# Patient Record
Sex: Male | Born: 1955 | Race: White | Hispanic: No | Marital: Single | Smoking: Never smoker
Health system: Southern US, Community
[De-identification: ages and names within clinical notes are randomized; demographics above are authoritative.]

## PROBLEM LIST (undated history)

## (undated) DIAGNOSIS — E785 Hyperlipidemia, unspecified: Secondary | ICD-10-CM

## (undated) DIAGNOSIS — J869 Pyothorax without fistula: Secondary | ICD-10-CM

## (undated) HISTORY — DX: Pyothorax without fistula: J86.9

## (undated) HISTORY — DX: Hyperlipidemia, unspecified: E78.5

---

## 2009-03-09 DIAGNOSIS — J869 Pyothorax without fistula: Secondary | ICD-10-CM

## 2009-03-09 HISTORY — DX: Pyothorax without fistula: J86.9

## 2009-03-09 HISTORY — PX: EMPYEMA DRAINAGE: SHX5097

## 2009-10-11 ENCOUNTER — Encounter (INDEPENDENT_AMBULATORY_CARE_PROVIDER_SITE_OTHER): Payer: Self-pay | Admitting: *Deleted

## 2009-10-21 ENCOUNTER — Ambulatory Visit (HOSPITAL_COMMUNITY): Admission: EM | Admit: 2009-10-21 | Discharge: 2009-10-22 | Payer: Self-pay | Admitting: Emergency Medicine

## 2009-10-21 ENCOUNTER — Ambulatory Visit: Payer: Self-pay | Admitting: Internal Medicine

## 2009-10-31 ENCOUNTER — Ambulatory Visit: Payer: Self-pay | Admitting: Family Medicine

## 2009-10-31 ENCOUNTER — Inpatient Hospital Stay (HOSPITAL_COMMUNITY): Admission: EM | Admit: 2009-10-31 | Discharge: 2009-11-06 | Payer: Self-pay | Admitting: Emergency Medicine

## 2009-10-31 ENCOUNTER — Ambulatory Visit: Payer: Self-pay | Admitting: Internal Medicine

## 2009-11-01 ENCOUNTER — Encounter: Payer: Self-pay | Admitting: Family Medicine

## 2009-11-01 ENCOUNTER — Ambulatory Visit: Payer: Self-pay | Admitting: Thoracic Surgery (Cardiothoracic Vascular Surgery)

## 2009-11-12 ENCOUNTER — Ambulatory Visit: Payer: Self-pay | Admitting: Thoracic Surgery (Cardiothoracic Vascular Surgery)

## 2009-11-13 ENCOUNTER — Encounter (INDEPENDENT_AMBULATORY_CARE_PROVIDER_SITE_OTHER): Payer: Self-pay | Admitting: *Deleted

## 2009-11-13 ENCOUNTER — Telehealth: Payer: Self-pay | Admitting: Internal Medicine

## 2009-11-13 ENCOUNTER — Ambulatory Visit: Payer: Self-pay | Admitting: Internal Medicine

## 2009-11-25 ENCOUNTER — Ambulatory Visit: Payer: Self-pay | Admitting: Thoracic Surgery (Cardiothoracic Vascular Surgery)

## 2010-01-03 DIAGNOSIS — J869 Pyothorax without fistula: Secondary | ICD-10-CM | POA: Insufficient documentation

## 2010-01-03 DIAGNOSIS — E785 Hyperlipidemia, unspecified: Secondary | ICD-10-CM | POA: Insufficient documentation

## 2010-01-03 DIAGNOSIS — T18108A Unspecified foreign body in esophagus causing other injury, initial encounter: Secondary | ICD-10-CM

## 2010-01-03 DIAGNOSIS — K209 Esophagitis, unspecified without bleeding: Secondary | ICD-10-CM | POA: Insufficient documentation

## 2010-01-03 DIAGNOSIS — K449 Diaphragmatic hernia without obstruction or gangrene: Secondary | ICD-10-CM | POA: Insufficient documentation

## 2010-03-30 ENCOUNTER — Encounter: Payer: Self-pay | Admitting: Thoracic Surgery (Cardiothoracic Vascular Surgery)

## 2010-04-08 NOTE — Letter (Signed)
Summary: Previsit letter  [facility]  [address] [place], [place] [address]   Phone: [phone]  Fax: [phone]       10/11/2009 MRN: [id number]  [facility] [address] [place], [place]  [address]  Dear Willie Armstrong,  Welcome to the Gastroenterology Division at [facility].    You are scheduled to see a nurse for your pre-procedure visit on 11-13-09 at 1:00P.M. on the 3rd floor at [facility], [address].  We ask that you try to arrive at our office 15 minutes prior to your appointment time to allow for check-in.  Your nurse visit will consist of discussing your medical and surgical history, your immediate family medical history, and your medications.    Please bring a complete list of all your medications or, if you prefer, bring the medication bottles and we will list them.  We will need to be aware of both prescribed and over the counter drugs.  We will need to know exact dosage information as well.  If you are on blood thinners (Coumadin, Plavix, Aggrenox, Ticlid, etc.) please call our office today/prior to your appointment, as we need to consult with your physician about holding your medication.   Please be prepared to read and sign documents such as consent forms, a financial agreement, and acknowledgement forms.  If necessary, and with your consent, a friend or relative is welcome to sit-in on the nurse visit with you.  Please bring your insurance card so that we may make a copy of it.  If your insurance requires a referral to see a specialist, please bring your referral form from your primary care physician.  No co-pay is required for this nurse visit.     If you cannot keep your appointment, please call [phone] to cancel or reschedule prior to your appointment date.  This allows [place] the opportunity to schedule an appointment for another patient in need of care.    Thank you for choosing [facility] for your  medical needs.  We appreciate the opportunity to care for you.  Please visit [place] at our website  to learn more about our practice.                     Sincerely.                                                                                                                   The Gastroenterology Division

## 2010-04-08 NOTE — Procedures (Signed)
Summary: Upper Endoscopy  Patient: Willie Armstrong Note: All result statuses are Final unless otherwise noted.  Tests: (1) Upper Endoscopy (EGD)   EGD Upper Endoscopy       DONE     [facility]     [address] [place], [place]  [address]           ENDOSCOPY PROCEDURE REPORT           PATIENT:  Willie, Armstrong  MR#:  [id number]     BIRTHDATE:  12/20/1955, 54 yrs. old  GENDER:  male           ENDOSCOPIST:  Hedwig Morton. Juanda Chance, MD     Referred by:           PROCEDURE DATE:  10/21/2009     PROCEDURE:  EGD with foreign body removal     ASA CLASS:  Class I     INDICATIONS:  food impaction           MEDICATIONS:   Versed 12.5 mg, Fentanyl 125 mcg     TOPICAL ANESTHETIC:  Cetacaine Spray           DESCRIPTION OF PROCEDURE:   After the risks benefits and     alternatives of the procedure were thoroughly explained, informed     consent was obtained.  The  endoscope was introduced through the     mouth and advanced to the second portion of the duodenum, without     limitations.  The instrument was slowly withdrawn as the mucosa     was fully examined.           The examination was normal with no endoscopic findings.     Retroflexion was not performed.  The scope was then withdrawn from     the patient and the procedure completed.           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           REPEAT EXAM:  3-4           ______________________________     Hedwig Morton. Juanda Chance, MD           CC:           n.     eSIGNED:   Hedwig Morton. Brodie  at 10/22/2009 12:08 AM           Willie Armstrong, Willie Armstrong, [id number]  Note: An exclamation mark (!) indicates a result that was not dispersed into the flowsheet. Document Creation Date: 10/22/2009 12:09 AM _______________________________________________________________________  (1) Order result status: Final Collection or observation date-time: 10/21/2009 23:42 Requested date-time:  Receipt date-time:  Reported date-time:  Referring Physician:   Ordering Physician: Lina Sar [phone]) Specimen Source:  Source: Launa Grill Order Number: [id number] Lab site:  Appended Document: Upper Endoscopy Janifer Adie, This pt had an food impaction removed from last night. He needs repeat EGD/Dil?Bx in several weeks. He said that he is planning to have a colonoscopy at [facility] in September. It ought to be done at the same time. Can You arrange that? Thanx Dora  Appended Document: Upper Endoscopy Patient  is rescheduled to 11/28/09 for ESA and colon.  He will keep his pre-visit for 11/13/09

## 2010-04-08 NOTE — Progress Notes (Signed)
Summary: ?need to be seen in office  Dr Juanda Chance, You saw Mr. Willie Armstrong in ER for foood impaction on 10-22-2009.  2 days after that  he was admitted to hospital with pneumonia.  He was on vent and had chest tubes.  He does not want to have screening colonoscopy until he is stronger.  You had scheduled him for ESA at time of colonoscopy.  He is not having any difficulty swallowing at this time.  He has been scheduled for office visit for November.  Do you want to seem him in the office sooner than this? Thanks-- Willie Armstrong I think He ought to wait with the colonoscopy until he fully recoveres from the pneumonia.. I will see him in the office in November and then we decide what to do and when. DB

## 2010-04-08 NOTE — Letter (Signed)
Summary: New Patient letter  [facility]  [address] [place], [place] [address]   Phone: [phone]  Fax: [phone]       11/13/2009 MRN: [id number]  [facility] [address] [place], [place]  [address]  Dear Mr. Macgowan,  Welcome to the Gastroenterology Division at [facility].    You are scheduled to see Dr.  Juanda Chance on 01-09-10 at 10:30A.M. on the 3rd floor at [facility], [address].  We ask that you try to arrive at our office 15 minutes prior to your appointment time to allow for check-in.  We would like you to complete the enclosed self-administered evaluation form prior to your visit and bring it with you on the day of your appointment.  We will review it with you.  Also, please bring a complete list of all your medications or, if you prefer, bring the medication bottles and we will list them.  Please bring your insurance card so that we may make a copy of it.  If your insurance requires a referral to see a specialist, please bring your referral form from your primary care physician.  Co-payments are due at the time of your visit and may be paid by cash, check or credit card.     Your office visit will consist of a consult with your physician (includes a physical exam), any laboratory testing he/she may order, scheduling of any necessary diagnostic testing (e.g. x-ray, ultrasound, CT-scan), and scheduling of a procedure (e.g. Endoscopy, Colonoscopy) if required.  Please allow enough time on your schedule to allow for any/all of these possibilities.    If you cannot keep your appointment, please call [phone] to cancel or reschedule prior to your appointment date.  This allows [place] the opportunity to schedule an appointment for another patient in need of care.  If you do not cancel or reschedule by 5 p.m. the business day prior to your appointment date, you will be charged a $50.00 late cancellation/no-show fee.    Thank you for  choosing [facility] for your medical needs.  We appreciate the opportunity to care for you.  Please visit [place] at our website  to learn more about our practice.                     Sincerely,                                                             The Gastroenterology Division

## 2010-04-09 ENCOUNTER — Institutional Professional Consult (permissible substitution): Payer: Self-pay | Admitting: Critical Care Medicine

## 2010-05-23 ENCOUNTER — Institutional Professional Consult (permissible substitution) (INDEPENDENT_AMBULATORY_CARE_PROVIDER_SITE_OTHER): Payer: BC Managed Care – PPO | Admitting: Critical Care Medicine

## 2010-05-23 ENCOUNTER — Encounter: Payer: Self-pay | Admitting: Critical Care Medicine

## 2010-05-23 DIAGNOSIS — J869 Pyothorax without fistula: Secondary | ICD-10-CM

## 2010-05-23 LAB — BLOOD GAS, ARTERIAL
Acid-base deficit: 2 mmol/L (ref 0.0–2.0)
Bicarbonate: 25.8 mEq/L — ABNORMAL HIGH (ref 20.0–24.0)
Drawn by: 25203
Drawn by: 32470
O2 Content: 2 L/min
Patient temperature: 98.6
pCO2 arterial: 34.6 mmHg — ABNORMAL LOW (ref 35.0–45.0)
pCO2 arterial: 37.5 mmHg (ref 35.0–45.0)
pH, Arterial: 7.452 — ABNORMAL HIGH (ref 7.350–7.450)
pO2, Arterial: 92.2 mmHg (ref 80.0–100.0)

## 2010-05-23 LAB — CBC
Hemoglobin: 11.4 g/dL — ABNORMAL LOW (ref 13.0–17.0)
MCH: 30.8 pg (ref 26.0–34.0)
MCH: 31.9 pg (ref 26.0–34.0)
MCH: 32 pg (ref 26.0–34.0)
MCH: 33.1 pg (ref 26.0–34.0)
MCHC: 33.7 g/dL (ref 30.0–36.0)
MCHC: 33.8 g/dL (ref 30.0–36.0)
MCV: 91.4 fL (ref 78.0–100.0)
MCV: 93.2 fL (ref 78.0–100.0)
MCV: 94.9 fL (ref 78.0–100.0)
Platelets: 290 10*3/uL (ref 150–400)
Platelets: 309 10*3/uL (ref 150–400)
Platelets: 315 10*3/uL (ref 150–400)
Platelets: 391 10*3/uL (ref 150–400)
Platelets: 447 10*3/uL — ABNORMAL HIGH (ref 150–400)
RBC: 3.39 MIL/uL — ABNORMAL LOW (ref 4.22–5.81)
RBC: 3.47 MIL/uL — ABNORMAL LOW (ref 4.22–5.81)
RBC: 3.57 MIL/uL — ABNORMAL LOW (ref 4.22–5.81)
RBC: 4.71 MIL/uL (ref 4.22–5.81)
RDW: 12.7 % (ref 11.5–15.5)
RDW: 12.8 % (ref 11.5–15.5)
WBC: 12.9 10*3/uL — ABNORMAL HIGH (ref 4.0–10.5)
WBC: 14.3 10*3/uL — ABNORMAL HIGH (ref 4.0–10.5)
WBC: 14.8 10*3/uL — ABNORMAL HIGH (ref 4.0–10.5)

## 2010-05-23 LAB — COMPREHENSIVE METABOLIC PANEL
ALT: 32 U/L (ref 0–53)
AST: 13 U/L (ref 0–37)
AST: 15 U/L (ref 0–37)
AST: 22 U/L (ref 0–37)
AST: 91 U/L — ABNORMAL HIGH (ref 0–37)
Albumin: 1.8 g/dL — ABNORMAL LOW (ref 3.5–5.2)
Albumin: 2.5 g/dL — ABNORMAL LOW (ref 3.5–5.2)
Albumin: 3.8 g/dL (ref 3.5–5.2)
Alkaline Phosphatase: 85 U/L (ref 39–117)
BUN: 15 mg/dL (ref 6–23)
BUN: 4 mg/dL — ABNORMAL LOW (ref 6–23)
BUN: 9 mg/dL (ref 6–23)
CO2: 23 mEq/L (ref 19–32)
Calcium: 7.5 mg/dL — ABNORMAL LOW (ref 8.4–10.5)
Calcium: 8.2 mg/dL — ABNORMAL LOW (ref 8.4–10.5)
Calcium: 8.3 mg/dL — ABNORMAL LOW (ref 8.4–10.5)
Chloride: 100 mEq/L (ref 96–112)
Chloride: 100 mEq/L (ref 96–112)
Creatinine, Ser: 0.91 mg/dL (ref 0.4–1.5)
Creatinine, Ser: 0.98 mg/dL (ref 0.4–1.5)
Creatinine, Ser: 1.21 mg/dL (ref 0.4–1.5)
GFR calc Af Amer: >60 mL/min (ref >60)
GFR calc Af Amer: >60 mL/min (ref >60)
GFR calc Af Amer: >60 mL/min (ref >60)
GFR calc Af Amer: >60 mL/min (ref >60)
GFR calc non Af Amer: >60 mL/min (ref >60)
GFR calc non Af Amer: >60 mL/min (ref >60)
Glucose, Bld: 134 mg/dL — ABNORMAL HIGH (ref 70–99)
Potassium: 3.5 mEq/L (ref 3.5–5.1)
Potassium: 4.1 mEq/L (ref 3.5–5.1)
Sodium: 132 mEq/L — ABNORMAL LOW (ref 135–145)
Sodium: 134 mEq/L — ABNORMAL LOW (ref 135–145)
Total Bilirubin: 0.6 mg/dL (ref 0.3–1.2)
Total Bilirubin: 1 mg/dL (ref 0.3–1.2)
Total Protein: 5.1 g/dL — ABNORMAL LOW (ref 6.0–8.3)

## 2010-05-23 LAB — DIFFERENTIAL
Basophils Absolute: 0 10*3/uL (ref 0.0–0.1)
Eosinophils Absolute: 0 10*3/uL (ref 0.0–0.7)
Eosinophils Relative: 0 % (ref 0–5)
Lymphocytes Relative: 10 % — ABNORMAL LOW (ref 12–46)
Monocytes Absolute: 1.2 10*3/uL — ABNORMAL HIGH (ref 0.1–1.0)

## 2010-05-23 LAB — BASIC METABOLIC PANEL
BUN: 13 mg/dL (ref 6–23)
CO2: 25 mEq/L (ref 19–32)
Calcium: 7.5 mg/dL — ABNORMAL LOW (ref 8.4–10.5)
Chloride: 106 mEq/L (ref 96–112)
Creatinine, Ser: 0.98 mg/dL (ref 0.4–1.5)
Creatinine, Ser: 0.76 mg/dL (ref 0.4–1.5)
GFR calc Af Amer: >60 mL/min (ref >60)
GFR calc Af Amer: >60 mL/min (ref >60)
Glucose, Bld: 123 mg/dL — ABNORMAL HIGH (ref 70–99)
Sodium: 135 mEq/L (ref 135–145)

## 2010-05-23 LAB — LACTATE DEHYDROGENASE, PLEURAL OR PERITONEAL FLUID: LD, Fluid: 1181 U/L — ABNORMAL HIGH (ref 3–23)

## 2010-05-23 LAB — BODY FLUID CULTURE: Culture: NO GROWTH

## 2010-05-23 LAB — CARDIAC PANEL(CRET KIN+CKTOT+MB+TROPI)
CK, MB: 0.7 ng/mL (ref 0.3–4.0)
Relative Index: INVALID (ref 0.0–2.5)
Total CK: 50 U/L (ref 7–232)
Troponin I: 0.02 ng/mL (ref 0.00–0.06)

## 2010-05-23 LAB — TYPE AND SCREEN: ABO/RH(D): O POS

## 2010-05-23 LAB — POCT I-STAT 3, ART BLOOD GAS (G3+)
Acid-base deficit: 1 mmol/L (ref 0.0–2.0)
Bicarbonate: 22.7 mEq/L (ref 20.0–24.0)
O2 Saturation: 99 %
pO2, Arterial: 125 mmHg — ABNORMAL HIGH (ref 80.0–100.0)

## 2010-05-23 LAB — POCT CARDIAC MARKERS
CKMB, poc: <1 ng/mL — ABNORMAL LOW (ref 1.0–8.0)
Troponin i, poc: 0.07 ng/mL (ref 0.00–0.09)
Troponin i, poc: <0.05 ng/mL (ref 0.00–0.09)

## 2010-05-23 LAB — LIPASE, FLUID: Lipase-Fluid: 23 U/L

## 2010-05-23 LAB — ABO/RH: ABO/RH(D): O POS

## 2010-05-23 LAB — MRSA PCR SCREENING: MRSA by PCR: NEGATIVE

## 2010-05-23 LAB — BODY FLUID CELL COUNT WITH DIFFERENTIAL: Monocyte-Macrophage-Serous Fluid: 8 % — ABNORMAL LOW (ref 50–90)

## 2010-05-23 LAB — APTT: aPTT: 31 seconds (ref 24–37)

## 2010-05-23 LAB — GLUCOSE, SEROUS FLUID

## 2010-05-23 LAB — LACTATE DEHYDROGENASE: LDH: 106 U/L (ref 94–250)

## 2010-05-23 LAB — PROTIME-INR: INR: 1.31 (ref 0.00–1.49)

## 2010-05-23 LAB — CULTURE, BLOOD (ROUTINE X 2)

## 2010-05-23 LAB — HIV-1 RNA, QUALITATIVE, TMA: HIV-1 RNA, Qualitative, TMA: NOT DETECTED

## 2010-06-05 NOTE — Assessment & Plan Note (Signed)
Summary: Pulmonary Consultation   Copy to:  Eula Listen, PA @ Urgent Care at [facility] Primary Provider/Referring Provider:  Eula Listen, PA @ Urgent Care at [facility]  CC:  Pulmonary Consult  - empyema and recent PNA.  Marland Kitchen  History of Present Illness: Pulmonary Consultation 55 yo Hispanic Male,  hx of L empyema with aspiration pna, meat stuck in esophagus and ARDS/PNA  8/11:  PNA/ L empyema/ ARDS/vent / septic shock :  d/c 9/11.   Density in LLL persistes pleural based.   Last film 1/12.  No films sent for review, only reports.   symptoms now:  occ tightness in LLL.  no real cough.  only happens at odd times.  Feels can get full chest of air.  no exercises ok,  aerobics is ok,  no running, fast walking, weight training is all ok no fever/chills/sweats.  first time with pna had food caught in throat  no problems since.   Preventive Screening-Counseling & Management  Alcohol-Tobacco     Smoking Status: never  Current Medications (verified): 1)  Pravachol 40 Mg Tabs (Pravastatin Sodium) .... Take 1 Tablet By Mouth Once A Day 2)  Fish Oil 1000 Mg Caps (Omega-3 Fatty Acids) .... Take 2 Capsules By Mouth Once Daily 3)  Ibuprofen 600 Mg Tabs (Ibuprofen) .... Take 1 Tablet By Mouth Every 6 Hours As Needed  Allergies (verified): No Known Drug Allergies  Past History:  Past medical, surgical, family and social histories (including risk factors) reviewed, and no changes noted (except as noted below).  Past Medical History: Current Problems:  EMPYEMA (ICD-510.9)   -LLL  with ards/pna.  HYPERLIPIDEMIA (ICD-272.4) Hx of FOREIGN BODY IN ESOPHAGUS (ICD-935.1)    -aspiration of steak in esophagus lead to asp PNA and ARDS/empyema . ESOPHAGITIS (ICD-530.10) HIATAL HERNIA (ICD-553.3)    Past Surgical History: thoracoscopy for drainage of empyema   -8/11  Family History: Reviewed history from 01/03/2010 and no changes required. Family History of Heart Disease: Father sister deceased from  cancer   Social History: Reviewed history from 01/03/2010 and no changes required. 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  The patient denies shortness of breath with activity, shortness of breath at rest, productive cough, non-productive cough, coughing up blood, chest pain, irregular heartbeats, acid heartburn, loss of appetite, weight change, abdominal pain, difficulty swallowing, sore throat, tooth/dental problems, headaches, sneezing, itching, ear ache, anxiety, depression, hand/feet swelling, joint stiffness or pain, rash, change in color of mucus, and fever.    Vital Signs:  Patient profile:   55 year old male Height:      69 inches Weight:      177.50 pounds BMI:     26.31 O2 Sat:      95 % on Room air Temp:     98.4 degrees F oral Pulse rate:   78 / minute BP sitting:   98 / 62  (left arm) Cuff size:   large  Vitals Entered By: Gweneth Dimitri RN (May 23, 2010 2:58 PM)  O2 Flow:  Room air CC: Pulmonary Consult  - empyema, recent PNA.   Comments Medications reviewed with patient Daytime contact number verified with patient. Gweneth Dimitri RN  May 23, 2010 2:58 PM    Physical Exam  Additional Exam:  Gen:  Pleasant, well-nourished, in no distress,  normal affect ENT: No lesions,  mouth clear,  oropharynx clear, no postnasal drip Neck: No JVD, no TMG, no carotid bruits Lungs: No use of accessory muscles, no dullness to percussion, clear without rales or rhonchi Cardiovascular: RRR, heart sounds normal, no murmur or gallops, no peripheral edema Abdomen: soft and NT, no HSM,  BS normal Musculoskeletal: No deformities, no cyanosis or clubbing Neuro: alert, non focal Skin: Warm, no lesions or rashes    CXR  Procedure date:   04/03/2010  Findings:      stable pleuroparenchymal densities  Impression & Recommendations:  Problem # 1:  EMPYEMA (ICD-510.9) Assessment Improved hx of empyema with VATS drainage of L pleural space,  now resolved area under question is likely chronic pleural thickening , no further w/u needed plan no further w/u needed return pulm prn Orders: New Patient Level V ([address])  Complete Medication List: 1)  Pravachol 40 Mg Tabs (Pravastatin sodium) .... Take 1 tablet by mouth once a day 2)  Fish Oil 1000 Mg Caps (Omega-3 fatty acids) .... Take 2 capsules by mouth once daily 3)  Ibuprofen 600 Mg Tabs (Ibuprofen) .... Take 1 tablet by mouth every 6 hours as needed  Patient Instructions: 1)  No change in medications 2)  I will call when I get your chest xrays     Appended Document: Pulmonary Consultation fax ryan dunn [email or web address] [place]

## 2010-07-22 NOTE — Assessment & Plan Note (Signed)
OFFICE VISIT   Willie Armstrong, Willie Armstrong  DOB:  01-03-1956                                        November 25, 2009  CHART #:  [id number]   HISTORY:  The patient returns for routine followup status post left VATS  for drainage of empyema on November 01, 2009.  The patient had acute  suppurative empyema complicating community-acquired pneumonia.  He has  recovered uneventfully.  Since hospital discharge, he returns for  routine followup.  He has not had any problems.  He reports that he has  not had any pain.  He still has a little bit of numbness and a pressure-  like sensation in the left anterior chest wall typical for recent VATS  procedure.  He reports that he continues to exercise quite regularly and  he thinks his breathing capacity is almost back to baseline.  He has not  had any fevers or chills.  His cough has continued to gradually clear  up.  He completed his course of oral antibiotics and has had no further  problems.  The remainder of his review of systems is unremarkable.  The  remainder of his past medical history is unchanged.   PHYSICAL EXAMINATION:  Notable for well-appearing male with blood  pressure 137/88, pulse 92, and oxygen saturation 97% on room air.  Temperature 98.4 degrees Fahrenheit.  Examination of the chest reveals 3  small port incisions from video-assisted thoracoscopic surgery, all of  which are healing nicely.  Auscultation of the chest reveals clear  breath sounds with fairly symmetrical breath sounds that are slightly  diminished at the left lung base.  No wheezes, rales, or rhonchi are  noted.  Cardiovascular exam includes regular rate and rhythm.  The  abdomen is soft and nontender.  The extremities are warm and well  perfused.   DIAGNOSTIC TESTS:  Chest x-ray performed at Urgent Care on [address]  on November 21, 2009, is reviewed.  This demonstrates further interval  clearing of the lung parenchyma at the left lung  base in comparison with  the last chest x-ray performed prior to hospital discharge on November 06, 2009.  There is still some slight elevation of the left hemidiaphragm  and associated atelectasis and patchy opacity of the left lung base, but  there has been further significant clearing over the last 3 weeks.  No  other abnormalities are noted.   IMPRESSION:  Excellent progress following recent video-assisted  thoracoscopic surgery for drainage of acute suppurative empyema.   PLAN:  I have encouraged the patient to continue to gradually increase  his physical activity as tolerated.  He has no particular limitations at  this point.  All of his questions have been answered.  In the future, he  will call and return to see [place] as needed.   Salvatore Decent. Cornelius Moras, M.D.  Electronically Signed   CHO/MEDQ  D:  11/25/2009  T:  11/26/2009  Job:  [id number]   cc:   Ardyth Gal, MD

## 2011-02-16 ENCOUNTER — Other Ambulatory Visit: Payer: Self-pay | Admitting: Gastroenterology

## 2012-02-22 ENCOUNTER — Encounter: Payer: Self-pay | Admitting: Physician Assistant

## 2012-02-22 ENCOUNTER — Ambulatory Visit (INDEPENDENT_AMBULATORY_CARE_PROVIDER_SITE_OTHER): Payer: BC Managed Care – PPO | Admitting: Physician Assistant

## 2012-02-22 VITALS — BP 116/80 | HR 64 | Temp 98.0°F | Resp 16 | Ht 67.0 in | Wt 183.0 lb

## 2012-02-22 DIAGNOSIS — Z0000 Encounter for general adult medical examination without abnormal findings: Secondary | ICD-10-CM

## 2012-02-22 DIAGNOSIS — Z23 Encounter for immunization: Secondary | ICD-10-CM

## 2012-02-22 DIAGNOSIS — E785 Hyperlipidemia, unspecified: Secondary | ICD-10-CM

## 2012-02-22 LAB — POCT URINALYSIS DIPSTICK
Blood, UA: NEGATIVE
Leukocytes, UA: NEGATIVE
Nitrite, UA: NEGATIVE
Urobilinogen, UA: 0.2
pH, UA: 6

## 2012-02-22 LAB — CBC
MCH: 32.5 pg (ref 26.0–34.0)
MCHC: 35.8 g/dL (ref 30.0–36.0)
MCV: 90.7 fL (ref 78.0–100.0)
Platelets: 329 10*3/uL (ref 150–400)
RDW: 13.4 % (ref 11.5–15.5)

## 2012-02-22 LAB — POCT UA - MICROSCOPIC ONLY
Casts, Ur, LPF, POC: NEGATIVE
Mucus, UA: NEGATIVE
Yeast, UA: NEGATIVE

## 2012-02-22 NOTE — Progress Notes (Signed)
  Subjective:    Patient ID: Willie Armstrong, male    DOB: [birth year], 55 y.o.   MRN: [id number]  HPI    Review of Systems  Constitutional: Negative.   HENT: Negative.   Eyes: Negative.   Respiratory: Negative.   Cardiovascular: Negative.   Gastrointestinal: Negative.   Genitourinary: Negative.   Musculoskeletal: Negative.   Skin: Negative.   Neurological: Negative.   Hematological: Negative.   Psychiatric/Behavioral: Negative.        Objective:   Physical Exam        Assessment & Plan:

## 2012-02-23 ENCOUNTER — Other Ambulatory Visit: Payer: Self-pay | Admitting: Physician Assistant

## 2012-02-23 ENCOUNTER — Encounter: Payer: Self-pay | Admitting: Physician Assistant

## 2012-02-23 DIAGNOSIS — R195 Other fecal abnormalities: Secondary | ICD-10-CM

## 2012-02-23 LAB — TSH: TSH: 1.186 u[IU]/mL (ref 0.350–4.500)

## 2012-02-23 LAB — LIPID PANEL
Cholesterol: 206 mg/dL — ABNORMAL HIGH (ref 0–200)
LDL Cholesterol: 125 mg/dL — ABNORMAL HIGH (ref 0–99)
Total CHOL/HDL Ratio: 4.2 Ratio
VLDL: 32 mg/dL (ref 0–40)

## 2012-02-23 LAB — PSA: PSA: 0.5 ng/mL (ref <=4.00)

## 2012-02-23 LAB — COMPREHENSIVE METABOLIC PANEL
ALT: 43 U/L (ref 0–53)
AST: 23 U/L (ref 0–37)
Alkaline Phosphatase: 63 U/L (ref 39–117)
Creat: 0.75 mg/dL (ref 0.50–1.35)
Total Bilirubin: 0.8 mg/dL (ref 0.3–1.2)

## 2012-02-23 MED ORDER — ATORVASTATIN CALCIUM 20 MG PO TABS: 20.0000 mg | ORAL_TABLET | Freq: Every day | ORAL | Status: DC

## 2012-02-23 NOTE — Progress Notes (Signed)
Patient ID: Willie Armstrong MRN: [id number], DOB: 01-31-1956 56 y.o. Date of Encounter: 02/23/2012, 10:01 AM  Primary Physician: Carola Frost  Chief Complaint: Physical (CPE)  HPI: 56 y.o. y/o male with history noted below here for CPE. Doing well. No issues/complaints. Last CPE was . He has gotten back and feels like even surpassed where he was physically prior to his empyema episode in 2011. No issues with breathing. He is exercising almost daily and pushing himself hard. When he does exercise he really tries to deep breathe, and feels like this helps him. He does note occassionally he will have a catch in his back but this does not concern him. He feels like he is "back in my early 76's." Continues to eat a very healthy diet of fish and vegetables. Had his colonoscopy in 2012, removed a couple non-cancerous polyps, will have a repeat in 2017. He plans to live until he is 89.   Review of Systems: Consitutional: No fever, chills, fatigue, night sweats, lymphadenopathy, or weight changes. Eyes: No visual changes, eye redness, or discharge. ENT/Mouth: Ears: No otalgia, tinnitus, hearing loss, discharge. Nose: No congestion, rhinorrhea, sinus pain, or epistaxis. Throat: No sore throat, post nasal drip, or teeth pain. Cardiovascular: No CP, palpitations, diaphoresis, DOE, edema, orthopnea, PND. Respiratory: No cough, hemoptysis, SOB, or wheezing. Gastrointestinal: No anorexia, dysphagia, reflux, pain, nausea, vomiting, hematemesis, diarrhea, constipation, BRBPR, or melena. Genitourinary: No dysuria, frequency, urgency, hematuria, incontinence, nocturia, decreased urinary stream, discharge, impotence, or testicular pain/masses. Musculoskeletal: No decreased ROM, myalgias, stiffness, joint swelling, or weakness. Skin: No rash, erythema, lesion changes, pain, warmth, jaundice, or pruritis. Neurological: No headache, dizziness, syncope, seizures, tremors, memory loss, coordination problems, or  paresthesias. Psychological: No anxiety, depression, hallucinations, SI/HI. Endocrine: No fatigue, polydipsia, polyphagia, polyuria, or known diabetes. All other systems were reviewed and are otherwise negative.  Past Medical History  Diagnosis Date  . Hyperlipidemia      Past Surgical History  Procedure Date  . Empyema drainage 2011    Likely chemical, culture with no growth    Home Meds:  Prior to Admission medications   Medication Sig Start Date End Date Taking? Authorizing Provider  fish oil-omega-3 fatty acids 1000 MG capsule Take 2 g by mouth daily.   Yes Historical Provider, MD  pravastatin (PRAVACHOL) 40 MG tablet Take 40 mg by mouth daily.   Yes Historical Provider, MD    Allergies: No Known Allergies  History   Social History  . Marital Status: Single    Spouse Name: N/A    Number of Children: N/A  . Years of Education: N/A   Occupational History  . Not on file.   Social History Main Topics  . Smoking status: Never Smoker   . Smokeless tobacco: Not on file  . Alcohol Use: Yes  . Drug Use: No  . Sexually Active: Not on file   Other Topics Concern  . Not on file   Social History Narrative  . No narrative on file    Family History  Problem Relation Age of Onset  . Hypertension Mother   . Cancer Sister   . Diabetes Maternal Grandmother     Physical Exam: Blood pressure 116/80, pulse 64, temperature 98 F (36.7 C), temperature source Oral, resp. rate 16, height 5\' 7"  (1.702 m), weight 183 lb (83.008 kg), SpO2 97.00%.  General: Well developed, well nourished, in no acute distress. HEENT: Normocephalic, atraumatic. Conjunctiva pink, sclera non-icteric. Pupils 2 mm constricting to 1 mm, round, regular, and  equally reactive to light and accomodation. EOMI. Internal auditory canal clear. TMs with good cone of light and without pathology. Nasal mucosa pink. Nares are without discharge. No sinus tenderness. Oral mucosa pink and without lesions. Dentition  normal. Pharynx without exudate.   Neck: Supple. Trachea midline. No thyromegaly. Full ROM. No lymphadenopathy. Lungs: Clear to auscultation bilaterally without wheezes, rales, or rhonchi. Breathing is of normal effort and unlabored. Cardiovascular: RRR with S1 S2. No murmurs, rubs, or gallops appreciated. Distal pulses 2+ symmetrically. No carotid or abdominal bruits. Abdomen: Soft, non-tender, non-distended with normoactive bowel sounds. No hepatosplenomegaly or masses. No rebound/guarding. No CVA tenderness. Without hernias.  Rectal: No external hemorrhoids or fissures. Rectal vault without masses.  Genitourinary: Circumcised male. No penile lesions. Testes descended bilaterally, and smooth without tenderness or masses.  Musculoskeletal: Full range of motion and 5/5 strength throughout. Without swelling, atrophy, tenderness, crepitus, or warmth. Extremities without clubbing, cyanosis, or edema. Calves supple. Skin: Warm and moist without erythema, ecchymosis, wounds, or rash. Neuro: A+Ox3. CN II-XII grossly intact. Moves all extremities spontaneously. Full sensation throughout. Normal gait. DTR 2+ throughout upper and lower extremities. Finger to nose intact. Psych:  Responds to questions appropriately with a normal affect.   Studies:  Results for orders placed in visit on 02/22/12  POCT UA - MICROSCOPIC ONLY      Component Value Range   WBC, Ur, HPF, POC neg     RBC, urine, microscopic neg     Bacteria, U Microscopic neg     Mucus, UA neg     Epithelial cells, urine per micros 0-1     Crystals, Ur, HPF, POC neg     Casts, Ur, LPF, POC neg     Yeast, UA neg    POCT URINALYSIS DIPSTICK      Component Value Range   Color, UA yellow     Clarity, UA clear     Glucose, UA neg     Bilirubin, UA neg     Ketones, UA 15     Spec Grav, UA <=1.005     Blood, UA neg     pH, UA 6.0     Protein, UA neg     Urobilinogen, UA 0.2     Nitrite, UA neg     Leukocytes, UA Negative    IFOBT  (OCCULT BLOOD)      Component Value Range   IFOBT Positive    CBC      Component Value Range   WBC 7.9  4.0 - 10.5 K/uL   RBC 4.93  4.22 - 5.81 MIL/uL   Hemoglobin 16.0  13.0 - 17.0 g/dL   HCT 21.3  08.6 - 57.8 %   MCV 90.7  78.0 - 100.0 fL   MCH 32.5  26.0 - 34.0 pg   MCHC 35.8  30.0 - 36.0 g/dL   RDW 46.9  62.9 - 52.8 %   Platelets 329  150 - 400 K/uL  COMPREHENSIVE METABOLIC PANEL      Component Value Range   Sodium 136  135 - 145 mEq/L   Potassium 3.9  3.5 - 5.3 mEq/L   Chloride 102  96 - 112 mEq/L   CO2 21  19 - 32 mEq/L   Glucose, Bld 89  70 - 99 mg/dL   BUN 11  6 - 23 mg/dL   Creat 4.13  2.44 - 0.10 mg/dL   Total Bilirubin 0.8  0.3 - 1.2 mg/dL   Alkaline Phosphatase 63  39 - 117 U/L   AST 23  0 - 37 U/L   ALT 43  0 - 53 U/L   Total Protein 6.9  6.0 - 8.3 g/dL   Albumin 4.8  3.5 - 5.2 g/dL   Calcium 9.5  8.4 - 40.9 mg/dL  LIPID PANEL      Component Value Range   Cholesterol 206 (*) 0 - 200 mg/dL   Triglycerides 811 (*) <150 mg/dL   HDL 49  >91 mg/dL   Total CHOL/HDL Ratio 4.2     VLDL 32  0 - 40 mg/dL   LDL Cholesterol 478 (*) 0 - 99 mg/dL  PSA      Component Value Range   PSA 0.50  <=4.00 ng/mL  TSH      Component Value Range   TSH 1.186  0.350 - 4.500 uIU/mL    Assessment/Plan:  56 y.o. y/o male here for CPE with positive IFOBT, history of empyema, and hyperlipidemia. 1. CPE  -Influenza vaccine given today -Tetanus vaccine 2011 -Continue healthy diet and exercise -Anticipatory guidance  2. Hyperlipidemia -Change to Lipitor 20 mg 1 po daily #30 RF 11 -Stop Pravastatin -Healthy diet and exercise -Weight loss -Recheck fasting lipids 6 months  3. Positive IFOBT -Up to date with his colonoscopy, 2012, next due 2017 -Will plan to recheck hemasure in 1-2 weeks, if positive will require further evaluation. If negative, will send home with occult blood cards to collect and evaluate three separate stools.   4. History of empyema -Cleared by  pulmonology -No active issues -Continue with active lifestyle and deep breathing  Signed, Eula Listen, PA-C 02/23/2012 10:01 AM

## 2012-02-23 NOTE — Progress Notes (Signed)
3 hemasures to be left up front for patient.

## 2012-03-14 IMAGING — CR DG CHEST 2V
2 series · 2 of 2 positions shown · non-contrast
Comparison: Chest radiograph 11/05/1998

CLINICAL DATA: Chest pain, short of breath

CHEST - 2 VIEW

[w chest pa]
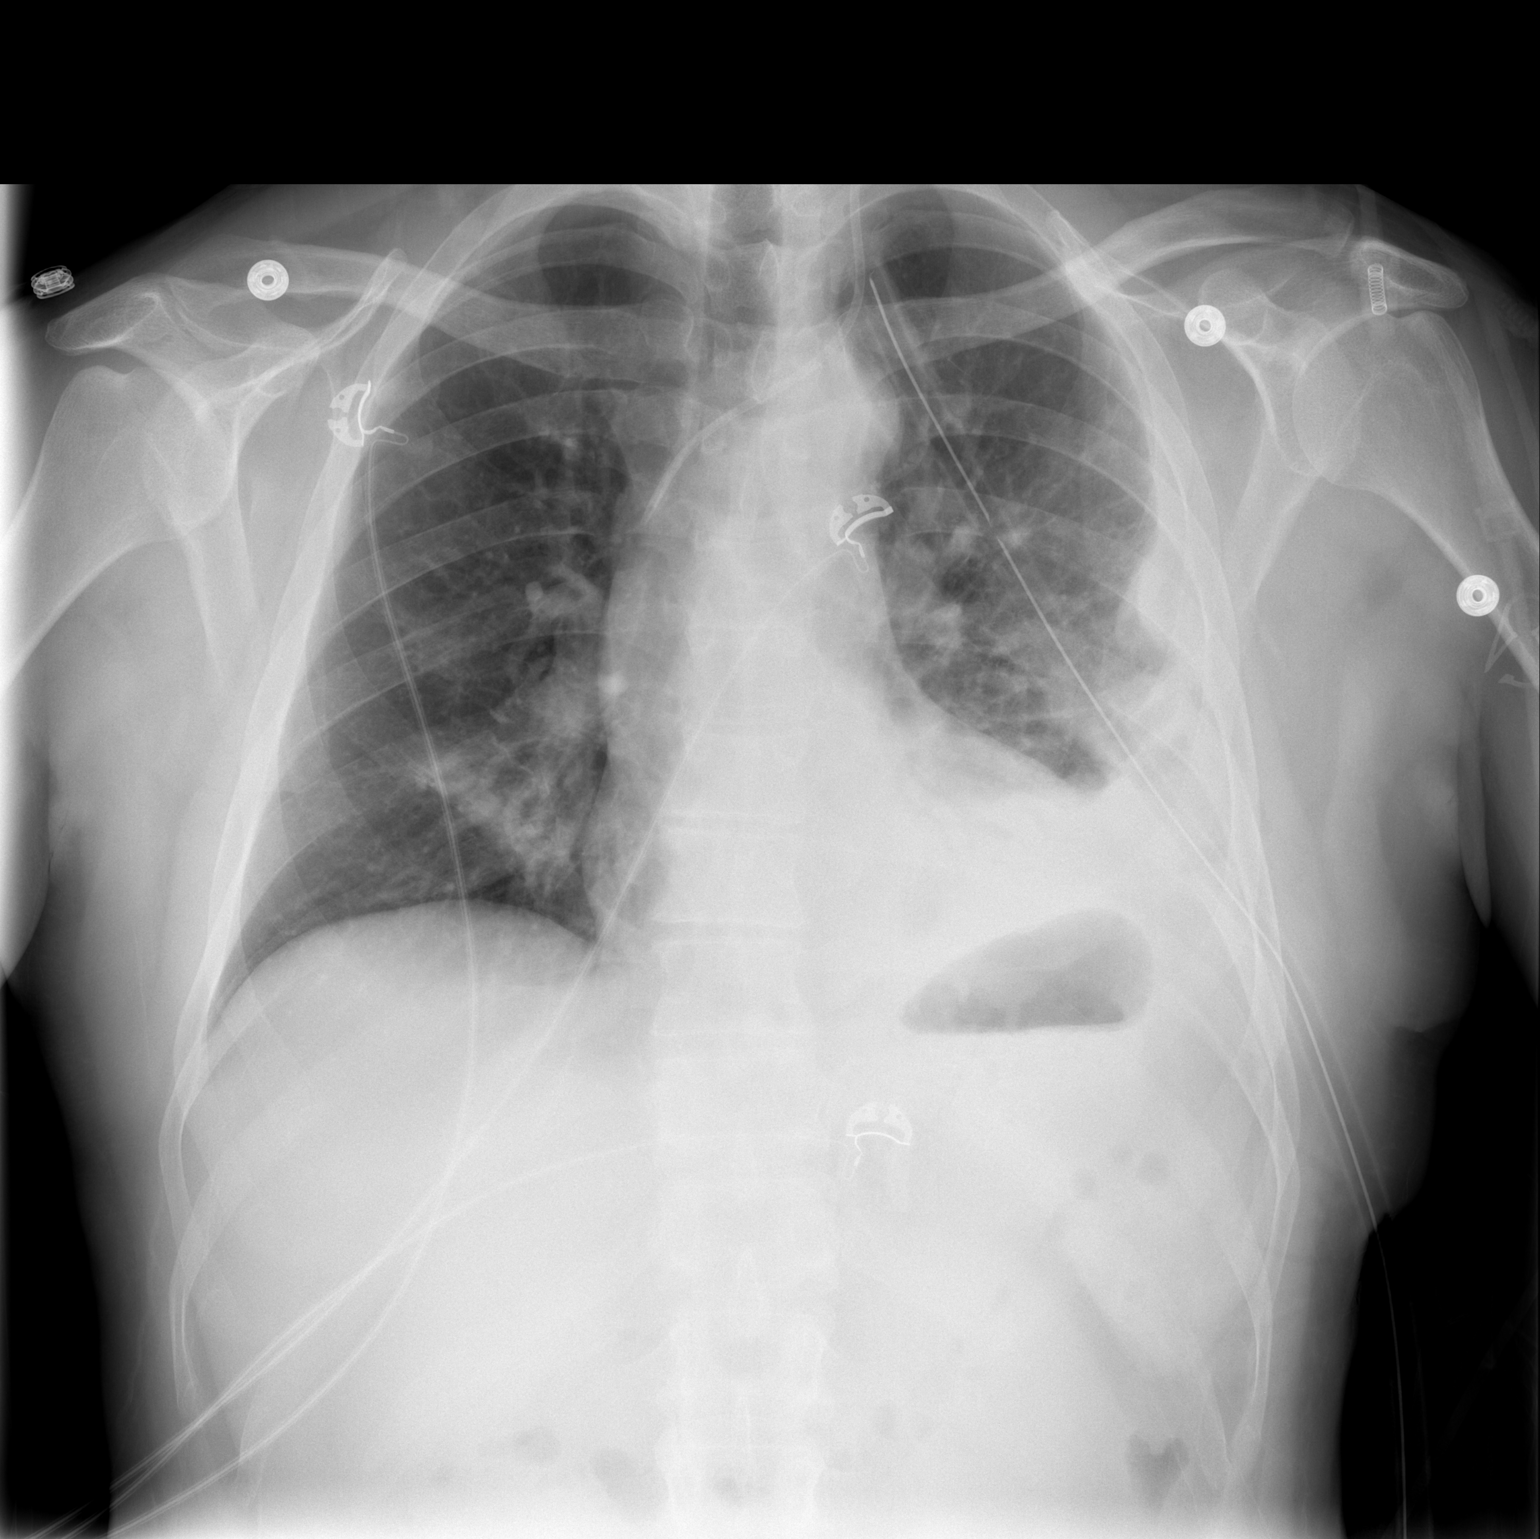

[w chest lat]
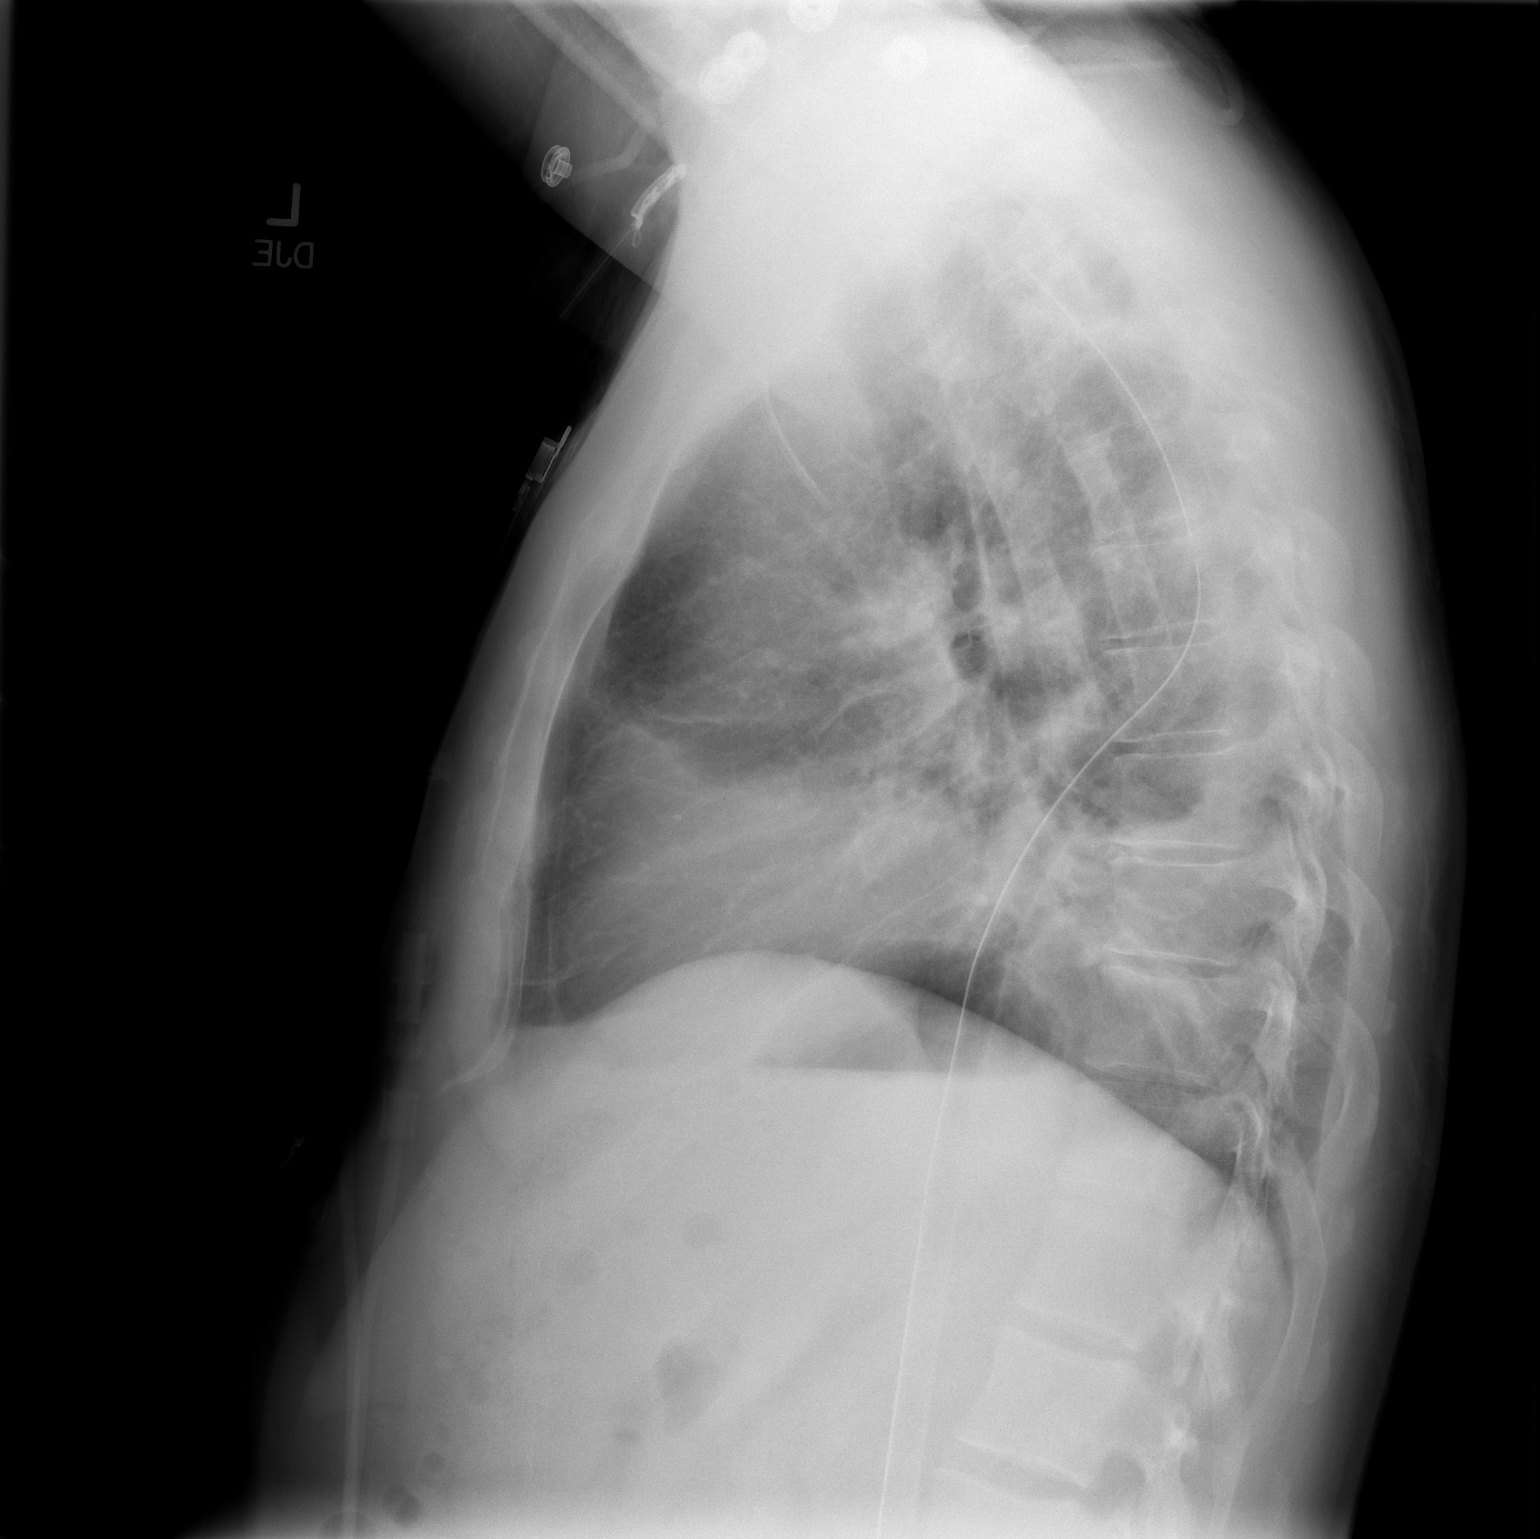

[2 of 2 positions shown; findings below may reference images not displayed]

FINDINGS: Left central venous line and left chest tube are
unchanged.  There is a small to moderate partially loculated left
pleural effusion which is unchanged from prior.  There is slight
improvement in the air space pattern within the left lung.  There
is right infrahilar atelectasis which is unchanged.  No
pneumothorax.
IMPRESSION: 1.  Improvement in air space disease in left upper lobe.
2.  Persistent partially loculated left pleural effusion and left
basilar atelectasis/infiltrate.
3.  No pneumothorax.

## 2012-04-13 ENCOUNTER — Encounter: Payer: Self-pay | Admitting: *Deleted

## 2013-03-05 ENCOUNTER — Other Ambulatory Visit: Payer: Self-pay | Admitting: Physician Assistant

## 2013-03-06 ENCOUNTER — Ambulatory Visit (INDEPENDENT_AMBULATORY_CARE_PROVIDER_SITE_OTHER): Payer: BC Managed Care – PPO | Admitting: Physician Assistant

## 2013-03-06 ENCOUNTER — Encounter: Payer: Self-pay | Admitting: Physician Assistant

## 2013-03-06 VITALS — BP 158/99 | HR 75 | Temp 98.3°F | Resp 16 | Ht 67.5 in | Wt 190.0 lb

## 2013-03-06 DIAGNOSIS — Z0000 Encounter for general adult medical examination without abnormal findings: Secondary | ICD-10-CM

## 2013-03-06 DIAGNOSIS — R195 Other fecal abnormalities: Secondary | ICD-10-CM

## 2013-03-06 DIAGNOSIS — E785 Hyperlipidemia, unspecified: Secondary | ICD-10-CM

## 2013-03-06 DIAGNOSIS — J869 Pyothorax without fistula: Secondary | ICD-10-CM

## 2013-03-06 LAB — CBC
HCT: 43.1 % (ref 39.0–52.0)
Hemoglobin: 15.1 g/dL (ref 13.0–17.0)
MCH: 32.1 pg (ref 26.0–34.0)
MCV: 91.7 fL (ref 78.0–100.0)
Platelets: 286 10*3/uL (ref 150–400)
RBC: 4.7 MIL/uL (ref 4.22–5.81)
WBC: 6.4 10*3/uL (ref 4.0–10.5)

## 2013-03-06 LAB — POCT UA - MICROSCOPIC ONLY
Bacteria, U Microscopic: NEGATIVE
Casts, Ur, LPF, POC: NEGATIVE
Crystals, Ur, HPF, POC: NEGATIVE

## 2013-03-06 LAB — COMPREHENSIVE METABOLIC PANEL
ALT: 51 U/L (ref 0–53)
AST: 24 U/L (ref 0–37)
CO2: 26 mEq/L (ref 19–32)
Calcium: 8.6 mg/dL (ref 8.4–10.5)
Chloride: 103 mEq/L (ref 96–112)
Creat: 0.72 mg/dL (ref 0.50–1.35)
Glucose, Bld: 98 mg/dL (ref 70–99)
Potassium: 4.3 mEq/L (ref 3.5–5.3)
Total Bilirubin: 0.6 mg/dL (ref 0.3–1.2)

## 2013-03-06 LAB — IFOBT (OCCULT BLOOD): IFOBT: POSITIVE

## 2013-03-06 LAB — POCT URINALYSIS DIPSTICK
Protein, UA: NEGATIVE
Spec Grav, UA: 1.015
Urobilinogen, UA: 0.2

## 2013-03-06 LAB — LIPID PANEL
Cholesterol: 158 mg/dL (ref 0–200)
HDL: 39 mg/dL — ABNORMAL LOW (ref >39)
LDL Cholesterol: 85 mg/dL (ref 0–99)
Triglycerides: 172 mg/dL — ABNORMAL HIGH (ref <150)

## 2013-03-06 LAB — TSH: TSH: 1.052 u[IU]/mL (ref 0.350–4.500)

## 2013-03-06 NOTE — Progress Notes (Signed)
Patient ID: Willie Armstrong MRN: [id number], DOB: [birth year] 57 y.o. Date of Encounter: 03/06/2013, 3:40 PM  Primary Physician: Carola Frost  Chief Complaint: Physical (CPE)  HPI: 57 y.o. male with history of empyema, and hyperlipidemia here for CPE. Doing well. Last physical was 02/22/2012.  1) Empyema: History of left sided empyema in 2011 secondary to a choking/aspiration episode. This was thought to be chemical in etiology as his cultures were negative at the time. He has been released by pulmonology. No issues with breathing. He is getting back to exercising almost daily. He continues to deep breathe, and feels like this helps him.   2) Hyperlipidemia: Currently taking Lipitor 20 mg daily without issues. Last lipid profile on 02/22/12 revealed a TC of 206, HDL of 49, trig's of 159, and LDL of 125. He continues to eat a healthy diet of fish and vegetables. See above for activity level.   3) CPE: Had colonoscopy in 2012. Next due in 2017. A couple of non-cancerous polyps were removed. He did have a hem positive stool at his CPE last year, however that was rechecked and was found to be negative on 03/16/12. He plans to live until he is 89. States he had a very hectic schedule this past semester. 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 This caused him to not have as much time for himself. He did not continue his exercise routine and feels like he has lost some muscle mass. He is in the process of restarting his exercise routine and is quite excited about this.   Review of Systems: Consitutional: No fever, chills, fatigue, night sweats, lymphadenopathy, or weight changes. Eyes: No visual changes, eye redness, or discharge. ENT/Mouth: Ears: No otalgia, tinnitus, hearing loss, discharge. Nose: No congestion, rhinorrhea, sinus pain, or epistaxis. Throat: No sore throat, post nasal drip, or teeth pain. Cardiovascular: No CP, palpitations,  diaphoresis, DOE, edema, orthopnea, PND. Respiratory: No cough, hemoptysis, SOB, or wheezing. Gastrointestinal: No anorexia, dysphagia, reflux, pain, nausea, vomiting, hematemesis, diarrhea, constipation, BRBPR, or melena. Genitourinary: No dysuria, frequency, urgency, hematuria, incontinence, nocturia, decreased urinary stream, discharge, impotence, or testicular pain/masses. Musculoskeletal: No decreased ROM, myalgias, stiffness, joint swelling, or weakness. Skin: No rash, erythema, lesion changes, pain, warmth, jaundice, or pruritis. Neurological: No headache, dizziness, syncope, seizures, tremors, memory loss, coordination problems, or paresthesias. Psychological: No anxiety, depression, hallucinations, SI/HI. Endocrine: No fatigue, polydipsia, polyphagia, polyuria, or known diabetes.   Past Medical History  Diagnosis Date  . Hyperlipidemia   . Empyema lung 2011    Left     Past Surgical History  Procedure Laterality Date  . Empyema drainage Left 2011    Likely chemical, culture with no growth    Home Meds:  Prior to Admission medications   Medication Sig Start Date End Date Taking? Authorizing Provider  atorvastatin (LIPITOR) 20 MG tablet Take 1 tablet (20 mg total) by mouth daily. PATIENT NEEDS OFFICE VISIT FOR ADDITIONAL REFILLS 03/05/13  Yes Felisia Balcom M Staci Dack, PA-C  fish oil-omega-3 fatty acids 1000 MG capsule Take 2 g by mouth daily.   Yes Historical Provider, MD    Allergies: No Known Allergies  History   Social History  . Marital Status: Single    Spouse Name: N/A    Number of Children: N/A  . Years of Education: N/A   Occupational History  . Not on file.   Social History Main Topics  . Smoking status: Never Smoker   .  Smokeless tobacco: Not on file  . Alcohol Use: 2.4 oz/week    4 Glasses of wine per week  . Drug Use: No  . Sexual Activity: Not on file   Other Topics Concern  . Not on file   Social History Narrative  . No narrative on file    Family  History  Problem Relation Age of Onset  . Hypertension Mother   . Cancer Sister   . Diabetes Maternal Grandmother     Physical Exam: Blood pressure 158/99, pulse 75, temperature 98.3 F (36.8 C), temperature source Oral, resp. rate 16, height 5' 7.5" (1.715 m), weight 190 lb (86.183 kg), SpO2 97.00%.  General: Well developed, well nourished, in no acute distress. HEENT: Normocephalic, atraumatic. Conjunctiva pink, sclera non-icteric. Pupils 2 mm constricting to 1 mm, round, regular, and equally reactive to light and accomodation. EOMI. Internal auditory canal clear. TMs with good cone of light and without pathology. Nasal mucosa pink. Nares are without discharge. No sinus tenderness. Oral mucosa pink. Dentition normal. Pharynx without exudate.   Neck: Supple. Trachea midline. No thyromegaly. Full ROM. No lymphadenopathy. Lungs: Clear to auscultation bilaterally without wheezes, rales, or rhonchi. Breathing is of normal effort and unlabored. Cardiovascular: RRR with S1 S2. No murmurs, rubs, or gallops appreciated. Distal pulses 2+ symmetrically. No carotid or abdominal bruits. Abdomen: Soft, non-tender, non-distended with normoactive bowel sounds. No hepatosplenomegaly or masses. No rebound/guarding. No CVA tenderness. Without hernias.  Rectal: No external hemorrhoids or fissures. Rectal vault without masses. Prostate not enlarged, smooth, symmetrical, without nodules, or TTP.  Genitourinary: Circumcised male. No penile lesions. Testes descended bilaterally, and smooth without tenderness or masses.  Musculoskeletal: Full range of motion and 5/5 strength throughout. Without swelling, atrophy, tenderness, crepitus, or warmth. Extremities without clubbing, cyanosis, or edema. Calves supple. Skin: Warm and moist without erythema, ecchymosis, wounds, or rash. Well healed left sided surgical scar along posterior chest wall. Neuro: A+Ox3. CN II-XII grossly intact. Moves all extremities spontaneously.  Full sensation throughout. Normal gait. DTR 2+ throughout upper and lower extremities. Finger to nose intact. Psych:  Responds to questions appropriately with a normal affect.   Studies:  Results for orders placed in visit on 03/06/13  POCT UA - MICROSCOPIC ONLY      Result Value Range   WBC, Ur, HPF, POC neg     RBC, urine, microscopic 0-2     Bacteria, U Microscopic neg     Mucus, UA neg     Epithelial cells, urine per micros 0-1     Crystals, Ur, HPF, POC neg     Casts, Ur, LPF, POC neg     Yeast, UA neg    POCT URINALYSIS DIPSTICK      Result Value Range   Color, UA yellow     Clarity, UA clear     Glucose, UA neg     Bilirubin, UA neg     Ketones, UA neg     Spec Grav, UA 1.015     Blood, UA trace     pH, UA 7.0     Protein, UA neg     Urobilinogen, UA 0.2     Nitrite, UA neg     Leukocytes, UA Negative    IFOBT (OCCULT BLOOD)      Result Value Range   IFOBT Positive       CBC, CMET, Lipid, PSA, TSH all pending. Patient is fasting.   Assessment/Plan:  57 y.o. male here for CPE with history of  hyperlipidemia, history of empyema, and hem positive stool.  1) CPE -Up to date with colonoscopy, 2012, next due 2017 -Await labs -Healthy diet and exercise -Weight loss -Age appropriate anticipatory guidance   2) Hyperlipidemia  -Await labs -Will refill statin based on lipids -Healthy diet and exercise -Weight loss  3) History of empyema  -Released by pulmonology -No active issues -Continue with active lifestyle -Let us know if develops any issues  4) Elevated blood pressure -Monitor closely  -If remains elevated will consider starting therapy, however this is an isolated reading and not appropriate to start therapy based on that -He will call is BP remains elevated  5) Hem positive stool -Patient had hem positive stool at his CPE on 02/22/12 that was later rechecked on 03/16/12 and found to be negative -Colonoscopy 2012, advised to have repeat in 2017  secondary to a couple non-cancerous polyps being removed.  -Advised patient to contact [facility] for follow up appointment to discuss hem positive stool. He agrees to do so.    Signed, Eula Listen, PA-C [facility] [place], [place] [address] [phone] 03/06/2013 3:40 PM

## 2013-03-06 NOTE — Progress Notes (Signed)
   Subjective:    Patient ID: Willie Armstrong, male    DOB: [birth year], 57 y.o.   MRN: [id number]  HPI    Review of Systems  Constitutional: Negative.   HENT: Negative.   Eyes: Negative.   Respiratory: Negative.   Cardiovascular: Negative.   Gastrointestinal: Negative.   Endocrine: Negative.   Genitourinary: Negative.   Musculoskeletal: Negative.   Skin: Negative.   Allergic/Immunologic: Negative.   Neurological: Negative.   Hematological: Negative.   Psychiatric/Behavioral: Negative.        Objective:   Physical Exam        Assessment & Plan:

## 2013-03-07 ENCOUNTER — Other Ambulatory Visit: Payer: Self-pay | Admitting: Physician Assistant

## 2013-03-07 DIAGNOSIS — E785 Hyperlipidemia, unspecified: Secondary | ICD-10-CM

## 2013-03-07 LAB — PSA: PSA: 0.6 ng/mL (ref <=4.00)

## 2013-03-07 MED ORDER — ATORVASTATIN CALCIUM 20 MG PO TABS: 20.0000 mg | ORAL_TABLET | Freq: Every day | ORAL | Status: DC

## 2013-03-28 ENCOUNTER — Telehealth: Payer: Self-pay

## 2013-03-28 NOTE — Telephone Encounter (Signed)
Pt requests most recent CPE records with [facility]. Pt made his own appt with [facility] for 04/06/13 and states they need records for that appt.   [facility] fax: [phone]  Pt best: [phone]  bf

## 2013-04-03 NOTE — Telephone Encounter (Signed)
Faxed through Epic recent CPE with [facility] with [facility].

## 2014-03-01 ENCOUNTER — Other Ambulatory Visit: Payer: Self-pay

## 2014-03-01 DIAGNOSIS — E785 Hyperlipidemia, unspecified: Secondary | ICD-10-CM

## 2014-03-01 MED ORDER — ATORVASTATIN CALCIUM 20 MG PO TABS: 20.0000 mg | ORAL_TABLET | Freq: Every day | ORAL | Status: DC

## 2014-03-28 ENCOUNTER — Other Ambulatory Visit: Payer: Self-pay | Admitting: Physician Assistant

## 2014-04-25 ENCOUNTER — Other Ambulatory Visit: Payer: Self-pay | Admitting: Physician Assistant
